# Patient Record
Sex: Male | Born: 1975 | Race: White | Hispanic: Yes | Marital: Married | Smoking: Never smoker
Health system: Southern US, Community
[De-identification: ages and names within clinical notes are randomized; demographics above are authoritative.]

---

## 2009-12-23 ENCOUNTER — Inpatient Hospital Stay (HOSPITAL_COMMUNITY): Admission: EM | Admit: 2009-12-23 | Discharge: 2009-12-25 | Payer: Self-pay | Admitting: Emergency Medicine

## 2010-10-08 LAB — DIFFERENTIAL
Eosinophils Absolute: 0 10*3/uL (ref 0.0–0.7)
Eosinophils Relative: 0 % (ref 0–5)
Neutro Abs: 16.1 10*3/uL — ABNORMAL HIGH (ref 1.7–7.7)
Neutrophils Relative %: 94 % — ABNORMAL HIGH (ref 43–77)

## 2010-10-08 LAB — BASIC METABOLIC PANEL
BUN: 24 mg/dL — ABNORMAL HIGH (ref 6–23)
CO2: 21 mEq/L (ref 19–32)
Calcium: 9.1 mg/dL (ref 8.4–10.5)
Chloride: 107 mEq/L (ref 96–112)
Creatinine, Ser: 1.13 mg/dL (ref 0.4–1.5)
GFR calc Af Amer: >60 mL/min (ref >60)
GFR calc non Af Amer: >60 mL/min (ref >60)
Sodium: 139 mEq/L (ref 135–145)

## 2010-10-08 LAB — CBC
HCT: 43.4 % (ref 39.0–52.0)
Hemoglobin: 15 g/dL (ref 13.0–17.0)
Platelets: 358 10*3/uL (ref 150–400)
RBC: 4.73 MIL/uL (ref 4.22–5.81)
RDW: 13.7 % (ref 11.5–15.5)

## 2010-10-25 IMAGING — CR DG CHEST 2V
2 series · 2 of 2 positions shown · non-contrast
Comparison: None

CLINICAL DATA: Fracture of the wrist.  Preoperative respiratory
exam.

CHEST - 2 VIEW

[w chest pa]
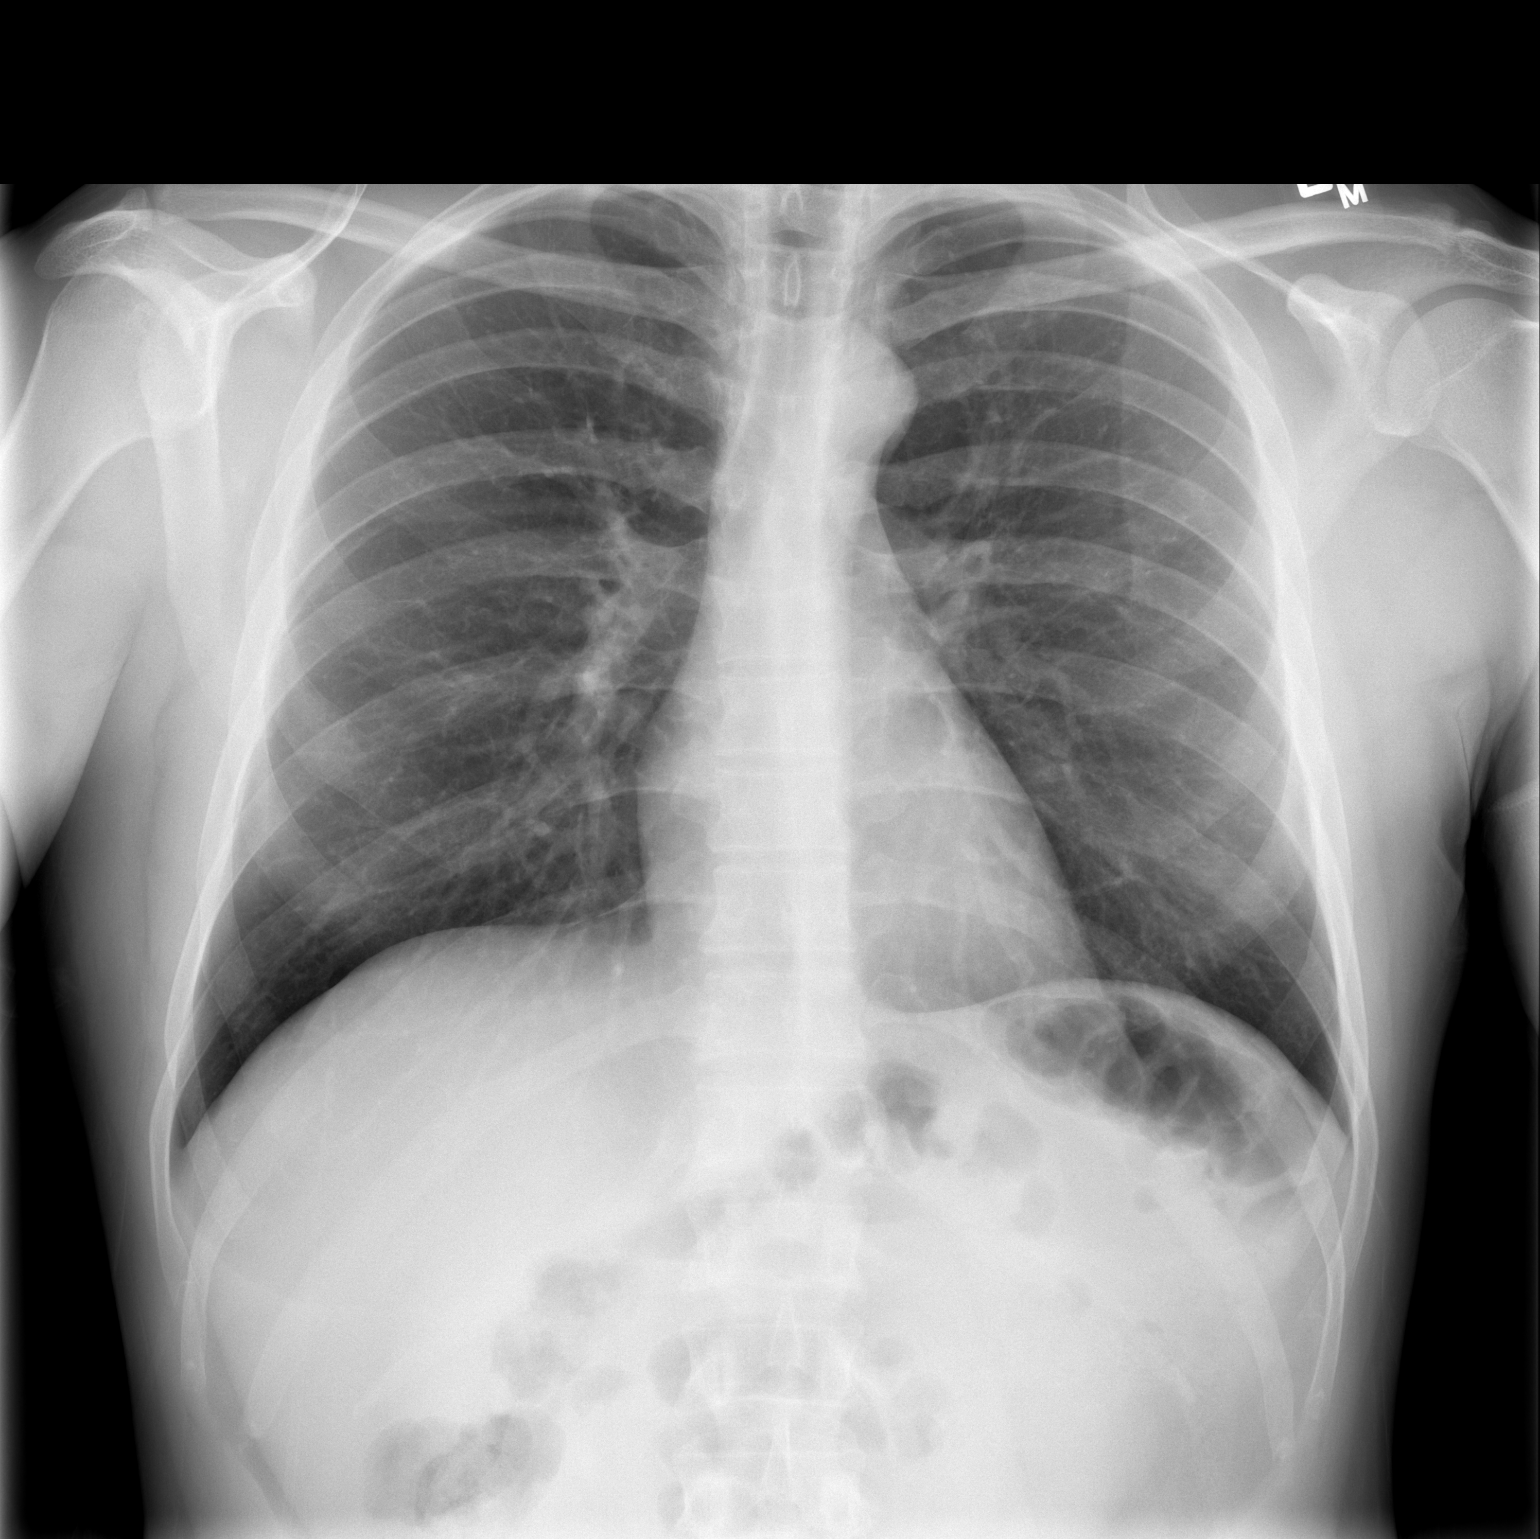

[w chest lat]
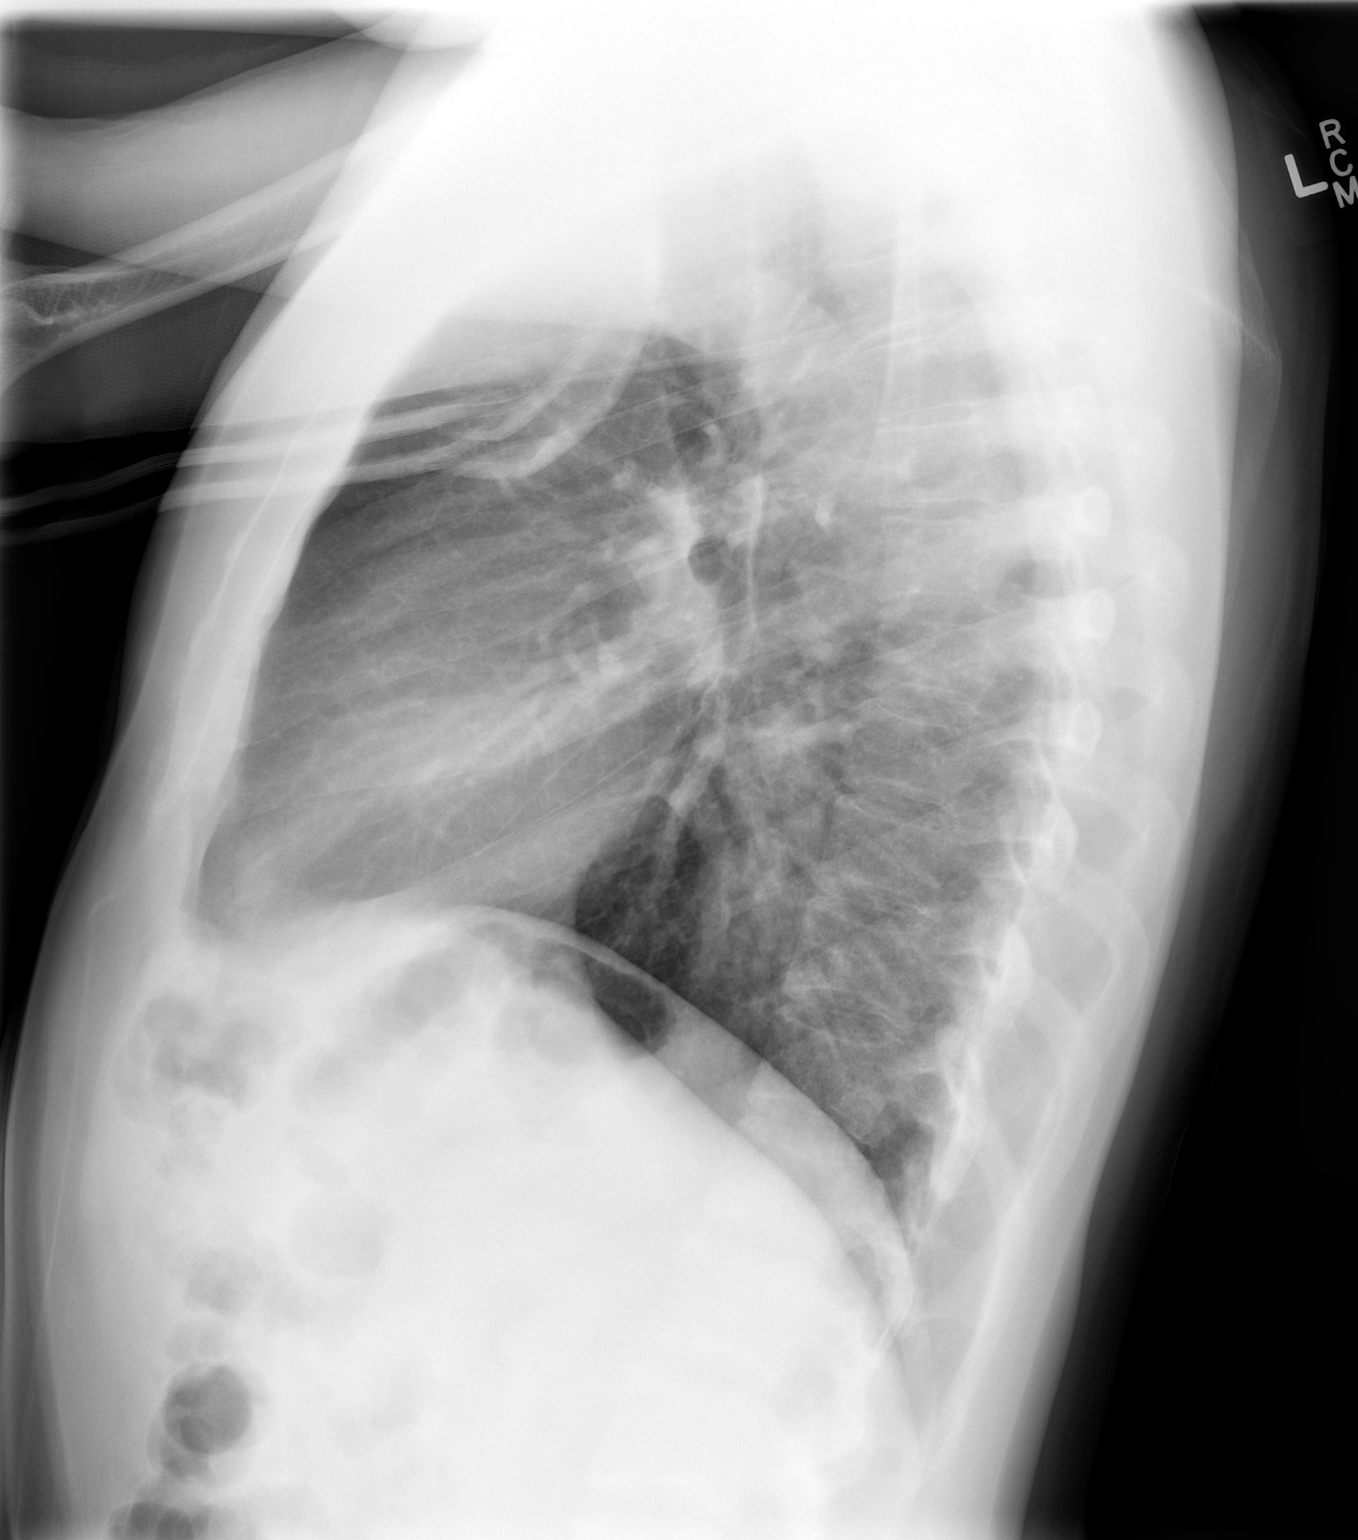

[2 of 2 positions shown; findings below may reference images not displayed]

FINDINGS: Heart size is normal.  The mediastinum is unremarkable.
The lungs are clear.  The vascularity is normal.  No effusions.  No
bony abnormalities.
IMPRESSION: Normal chest

## 2010-10-25 IMAGING — CT CT EXTREM UP W/O CM*L*
3 of 4 series · 11 of 20 positions shown, 13 images · non-contrast
Comparison: Radiography same Truy-Ann Golaub.

CLINICAL DATA: Soccer injury.  Fracture.

CT LEFT WRIST WITHOUT CONTRAST
TECHNIQUE: Multidetector CT imaging of the left wrist was
performed according to the standard protocol without intravenous
contrast. Multiplanar CT image reconstructions were also generated.

[Series 6: extremitywrist 2.0 (id) · axial · 0.42mm/px · z∈[+1018,+1138]mm · 6 of 86 slices shown, 8 images]
[im 13/86  soft-tissue]
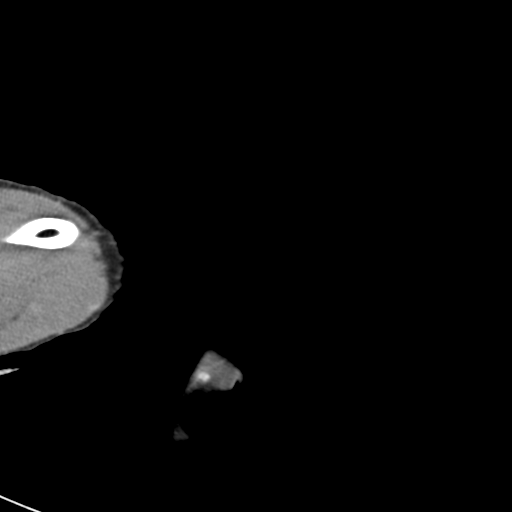
[im 13/86  bone]
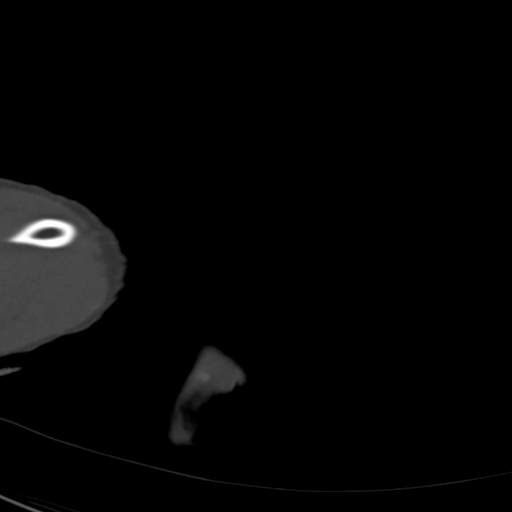
[im 25/86  bone]
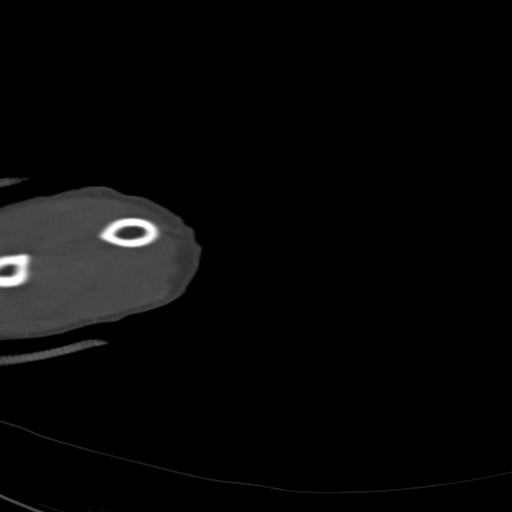
[im 37/86  bone]
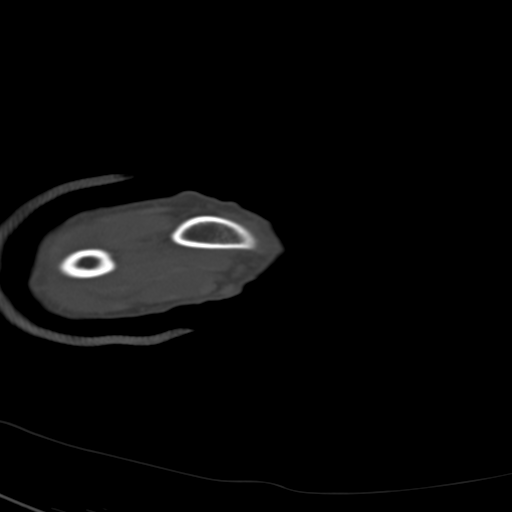
[im 49/86  bone]
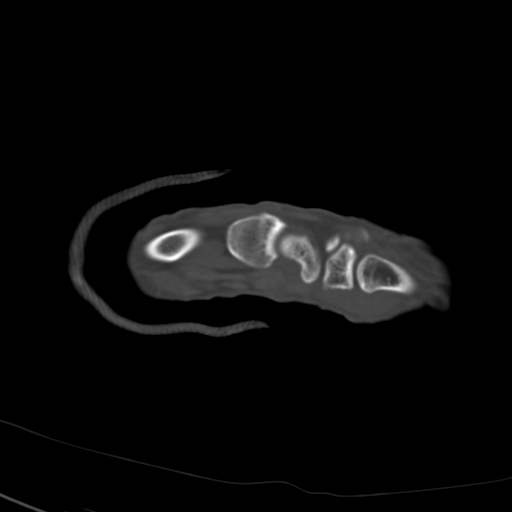
[im 61/86  soft-tissue]
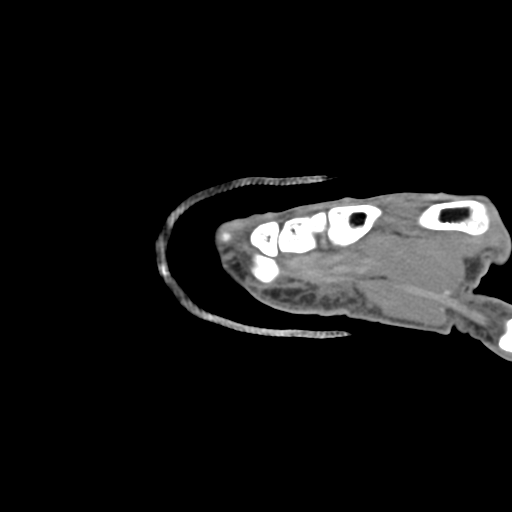
[im 61/86  bone]
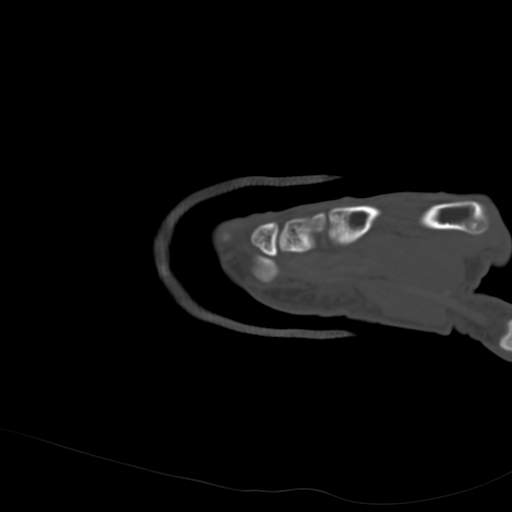
[im 73/86  bone]
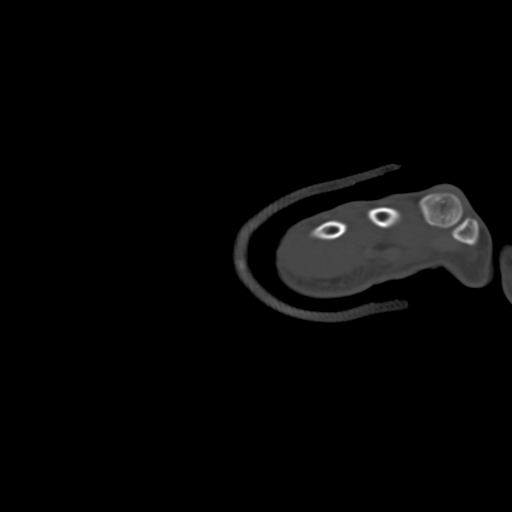

[Series 602: coronal · coronal · 0.42mm/px · 1 of 41 slices shown]
[im 21/41  bone]
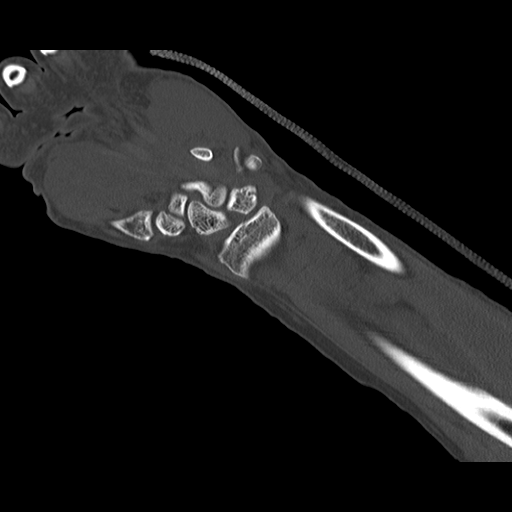

[Series 603: sag · axial · 0.42mm/px · z∈[+1128,+1165]mm · 4 of 63 slices shown]
[im 13/63  bone]
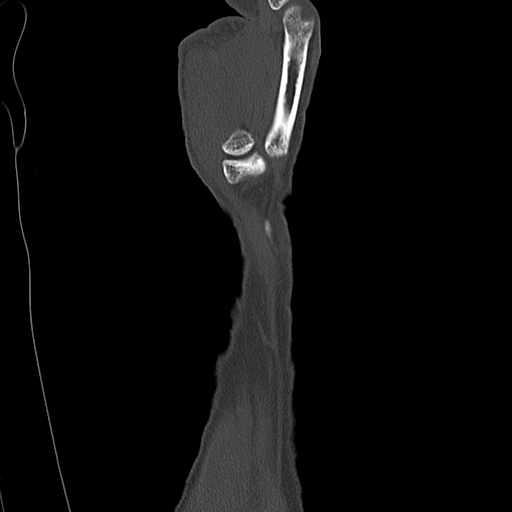
[im 25/63  bone]
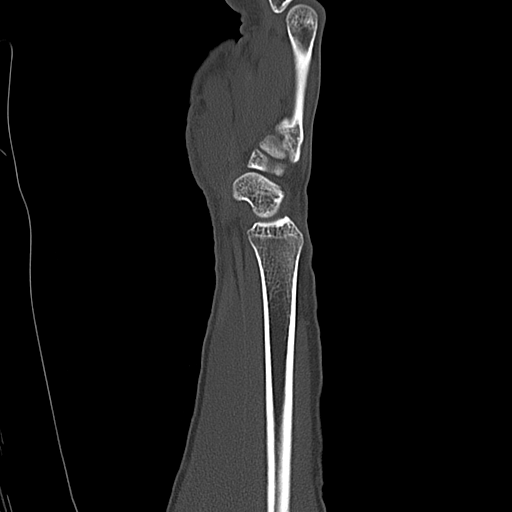
[im 38/63  bone]
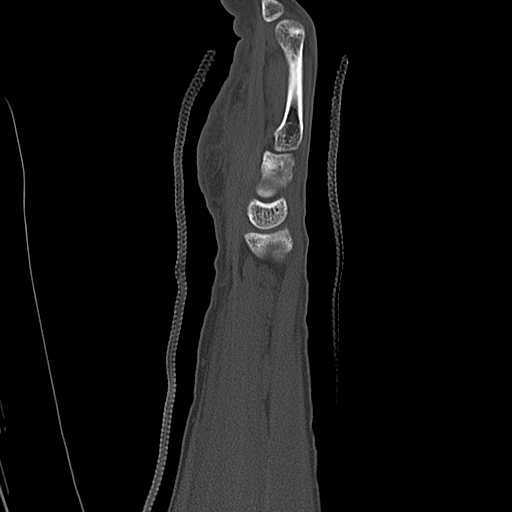
[im 50/63  bone]
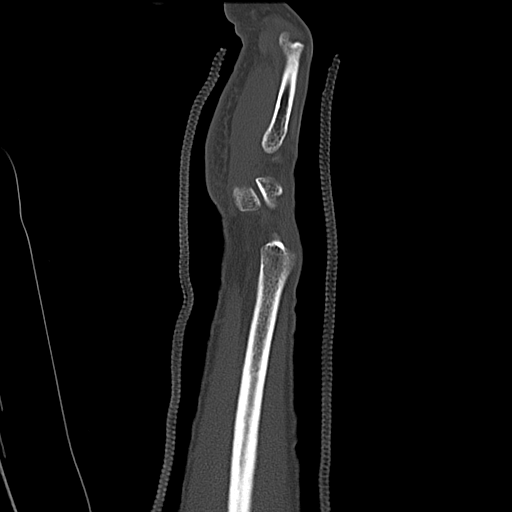

[11 of 20 positions shown; findings below may reference images not displayed]

FINDINGS: I do not see any fracture of the distal radius or ulna.
Carpal bones likewise appear normal.  No metacarpal injury or
metacarpal phalangeal joint injury is seen.  Beyond that is not
evaluated.

3-D re-formations are likewise normal.
IMPRESSION: Negative examination.  No discernible abnormality.

## 2017-01-10 ENCOUNTER — Telehealth: Payer: Self-pay | Admitting: General Practice

## 2017-01-10 NOTE — Telephone Encounter (Signed)
[facility] called to check the status of the referral for the patient sent via fax. Call patient to schedule

## 2017-01-14 NOTE — Telephone Encounter (Signed)
In review with Dr. Everardo AllEllison

## 2017-03-04 ENCOUNTER — Encounter: Payer: Self-pay | Admitting: Internal Medicine

## 2017-03-04 ENCOUNTER — Ambulatory Visit (INDEPENDENT_AMBULATORY_CARE_PROVIDER_SITE_OTHER): Payer: Self-pay | Admitting: Internal Medicine

## 2017-03-04 VITALS — BP 122/82 | HR 59 | Ht 67.0 in | Wt 181.0 lb

## 2017-03-04 DIAGNOSIS — E0590 Thyrotoxicosis, unspecified without thyrotoxic crisis or storm: Secondary | ICD-10-CM | POA: Diagnosis not present

## 2017-03-04 LAB — TSH: TSH: 2.95 u[IU]/mL (ref 0.35–4.50)

## 2017-03-04 LAB — T4, FREE: FREE T4: 0.8 ng/dL (ref 0.60–1.60)

## 2017-03-04 LAB — T3, FREE: T3 FREE: 3.4 pg/mL (ref 2.3–4.2)

## 2017-03-04 NOTE — Patient Instructions (Addendum)
Please stop at the lab.  Please come back for a follow-up appointment in 3 months.   Hipertiroidismo (Hyperthyroidism) El hipertiroidismo ocurre cuando la tiroides est demasiado activa (hiperactiva). La tiroides es una glndula grande ubicada en el cuello que ayuda a Chief Operating Officercontrolar la forma en que el organismo Botswanausa los alimentos (metabolismo). Cuando la tiroides est hiperactiva, produce una cantidad excesiva de una hormona llamada tiroxina. CAUSAS Las causas del hipertiroidismo pueden incluir lo siguiente:  Enfermedad de Graves-Basedow. Se produce cuando el sistema inmunitario ataca la tiroides. sta es la causa ms frecuente.  Inflamacin de la tiroides.  Tumor en la tiroides o Chief of Staffen otro lugar.  Uso excesivo de medicamentos para la tiroides, entre ellos: ? Suplementos recetados para la tiroides. ? Suplementos a base de hierbas que imitan a las hormonas tiroideas.  Bultos slidos o llenos de lquido en la tiroides (ndulos tiroideos).  Ingesta excesiva de yodo. FACTORES DE RIESGO  Ser mujer.  Tener antecedentes familiares de enfermedades tiroideas.  SIGNOS Y SNTOMAS Los signos y los sntomas de hipertiroidismo pueden ser los siguientes:  Nerviosismo.  Incapacidad para Patent examinertolerar el calor.  Prdida de peso sin causa aparente.  Diarrea.  Cambios en la textura del pelo o la piel.  Falta de latidos cardacos o ms latidos cardacos.  Frecuencia cardaca acelerada.  Ausencia de la Brink's Companymenstruacin.  Temblores en las manos.  Fatiga.  Agitacin.  Aumento del apetito.  Problemas para dormir.  Aumento del tamao de la tiroides o ndulos. DIAGNSTICO El diagnstico de hipertiroidismo puede incluir lo siguiente:  Examen fsico e historia clnica.  Anlisis de [place]ald[place]angre.  Ecografas. TRATAMIENTO El tratamiento puede incluir lo siguiente:  Medicamentos para Scientist, physiologicalcontrolar la tiroides.  Ciruga para extirpar la tiroides.  Radioterapia. INSTRUCCIONES PARA EL CUIDADO EN EL  HOGAR  Tome los medicamentos solamente como se lo haya indicado el mdico.  No consuma ningn producto que contenga tabaco, lo que incluye cigarrillos, tabaco de Theatre managermascar o Administrator, Civil Servicecigarrillos electrnicos. Si necesita ayuda para dejar de fumar, consulte al mdico.  No reanude la actividad fsica ni los ejercicios hasta que el mdico lo autorice.  Concurra a todas las visitas de control, segn le indique su mdico. Esto es importante.  SOLICITE ATENCIN MDICA SI:  Los sntomas no mejoran con Scientist, research (medical)el tratamiento.  Tiene fiebre.  Est tomando medicamentos sustitutivos de la tiroides y: ? Est deprimido. ? Se siente mental y fsicamente lento. ? Aumenta de Enetaipeso.  SOLICITE ATENCIN MDICA DE INMEDIATO SI:  Disminuy su estado de alerta o hay cambios en la conciencia.  Siente dolor abdominal.  Siente mareos.  Tiene una frecuencia cardaca acelerada.  Tiene latidos cardacos irregulares.  Esta informacin no tiene Theme park managercomo fin reemplazar el consejo del mdico. Asegrese de hacerle al mdico cualquier pregunta que tenga. Document Released: 07/08/2005 Document Revised: 07/29/2014 Document Reviewed: 11/23/2013 Elsevier Interactive Patient Education  2017 [employer]

## 2017-03-04 NOTE — Progress Notes (Signed)
Patient ID: Xavier Lambert, male   DOB: Jan 01, 1976, 41 y.o.   MRN: [id number]    HPI  Xavier Lambert is a 41 y.o.-year-old male, referred by his PCP, Xavier Monday, FNP  for evaluation for thyrotoxicosis. He is here with his wife who translates for [place] and offers more information about patient's symptoms from 2 months ago, and also past medical history.   Patient presented to see his PCP on 12/27/2016 complaining of sore throat, ear pain, night fevers, chills, joint aches and fatigue. A TSH checked at that time was very suppressed. At that time, he was started on ABx >> URI resolved. Since yesterday, he started to have congestion and sensation he has to clear his throat.  I riewed pt's thyroid tests: 01/02/2017: TSH 0.006 - he had a URI at that time. No results found for: TSH, FREET4   Pt denies feeling nodules in neck, hoarseness, dysphagia/odynophagia, SOB with lying down; he c/o: - + fatigue >> improved - resolved excessive sweating/heat intolerance - no tremors - no anxiety - no palpitations - no hyperdefecation - + weight gain - + fluid retention  Pt does have a FH of thyroid ds - father and sister, niece. No FH of thyroid cancer. No h/o radiation tx to head or neck.  No seaweed or kelp, no recent contrast studies. + steroid use in L back -2x inj - 6 mo ago. No herbal supplements. No Biotin use.  He has Prostate Htf >> on tamsulosin.  ROS: Constitutional: + please see HPI Eyes: no blurry vision, no xerophthalmia ENT: no sore throat, + see HPI Cardiovascular: no CP/SOB/palpitations/leg swelling Respiratory: no cough/SOB Gastrointestinal: no N/V/D/C Musculoskeletal: no muscle/joint aches Skin: no rashes, + hair loss Neurological: no tremors/numbness/tingling/dizziness Psychiatric: no depression/anxiety  PMH: - Prostate hypertrophy - Fatty liver   No PSxH.   Social History   Social History  . Marital status: Married    Spouse name: N/A  . Number of children:  1   Occupational History  . painter   Social History Main Topics  . Smoking status: Never Smoker  . Smokeless tobacco: Never Used  . Alcohol use 2 beers a mo  . Drug use: No   Current Outpatient Prescriptions  Medication Sig Dispense Refill  . tamsulosin (FLOMAX) 0.4 MG CAPS capsule TAKE ONE CAPSULE BY MOUTH DAILY 1/2 HOUR AFTER SUPPER  3   No current facility-administered medications for this visit.    Allergies no known allergies   FH: - please see HPI  PE: BP 122/82 (BP Location: Left Arm, Patient Position: Sitting)   Pulse (!) 59   Ht 5\' 7"  (1.702 m)   Wt 181 lb (82.1 kg)   SpO2 98%   BMI 28.35 kg/m  Wt Readings from Last 3 Encounters:  03/04/17 181 lb (82.1 kg)   Constitutional: overweight, in NAD Eyes: PERRLA, EOMI, no exophthalmos, no lid lag, no stare ENT: moist mucous membranes, + slight symmetric thyromegaly, no thyroid bruits, no cervical lymphadenopathy Cardiovascular: RRR, No MRG Respiratory: CTA B Gastrointestinal: abdomen soft, NT, ND, BS+ Musculoskeletal: no deformities, strength intact in all 4 Skin: moist, warm, no rashes Neurological: no tremor with outstretched hands, DTR normal in all 4  ASSESSMENT: 1. Thyrotoxicosis  PLAN:  1. Patient with a low TSH checked 2 mo ago,  during an episode of URI. At that time, he had fever, chills, fatigue, joint pain which have all resolved.  In the last few days, he also feels a little under the weather,  but without the symptoms that he had in June. - We discussed that possible causes of thyrotoxicosis are:  Graves ds   Thyroiditis (most likely dx) toxic multinodular goiter/ toxic adenoma (I cannot feel nodules at palpation of his thyroid). - I suggested that we check the TSH, fT3 and fT4 and also add thyroid stimulating antibodies to screen for Graves' disease.  - If the tests remain abnormal, we may need an uptake and scan to differentiate between the 3 above possible etiologies  - we discussed about  possible modalities of treatment for the above conditions, to include methimazole use, radioactive iodine ablation or (last resort) surgery. - we might need to do thyroid ultrasound depending on the results of the uptake and scan (if a cold nodule is present) - I do not feel that we need to add beta blockers at this time, since he is not tachycardic, anxious, or tremulous - I advised him to join my chart to communicate easier - RTC in 3 months, but likely sooner for repeat labs  Component     Latest Ref Rng & Units 03/04/2017  T4,Free(Direct)     0.60 - 1.60 ng/dL 1.610.80  Triiodothyronine,Free,Serum     2.3 - 4.2 pg/mL 3.4  TSH     0.35 - 4.50 uIU/mL 2.95  TSI     <140 % baseline <89   Labs are now normal. We'll repeat his labs in 3 months, but no intervention is needed for now. It does appear that she had an episode of thyroiditis which has resolved.  Carlus Pavlovristina Jon Kasparek, MD PhD [facility]

## 2017-03-07 ENCOUNTER — Telehealth: Payer: Self-pay

## 2017-03-07 LAB — THYROID STIMULATING IMMUNOGLOBULIN

## 2017-03-07 NOTE — Telephone Encounter (Signed)
-----   Message from Carlus Pavlov, MD sent at 03/07/2017 12:56 PM EDT ----- Raynelle Fanning, can you please call pt:  Labs are now normal. We'll repeat his labs in 3 months when he returns, but no intervention is needed for now. It does appear that she had an episode of thyroiditis (thyroid inflammation) which has resolved.

## 2017-03-07 NOTE — Telephone Encounter (Signed)
Attempted to contact patient, number not in service.

## 2017-03-11 ENCOUNTER — Telehealth: Payer: Self-pay

## 2017-03-11 NOTE — Telephone Encounter (Signed)
Attempted to contact patient again regarding the lab. Number not in service, will print letter to mail to patient.

## 2017-03-11 NOTE — Telephone Encounter (Signed)
-----   Message from Cristina Gherghe, MD sent at 03/07/2017 12:56 PM EDT ----- Edmund Holcomb, can you please call pt:  Labs are now normal. We'll repeat his labs in 3 months when he returns, but no intervention is needed for now. It does appear that she had an episode of thyroiditis (thyroid inflammation) which has resolved. 

## 2017-03-16 ENCOUNTER — Encounter: Payer: Self-pay | Admitting: Internal Medicine

## 2017-06-06 ENCOUNTER — Ambulatory Visit: Payer: BC Managed Care – PPO | Admitting: Internal Medicine

## 2017-09-10 ENCOUNTER — Ambulatory Visit: Payer: BC Managed Care – PPO | Admitting: Sports Medicine

## 2017-09-10 ENCOUNTER — Encounter: Payer: Self-pay | Admitting: Sports Medicine

## 2017-09-10 VITALS — BP 125/81 | HR 59 | Ht 66.0 in | Wt 191.0 lb

## 2017-09-10 DIAGNOSIS — M25571 Pain in right ankle and joints of right foot: Secondary | ICD-10-CM | POA: Diagnosis not present

## 2017-09-10 DIAGNOSIS — G8929 Other chronic pain: Secondary | ICD-10-CM

## 2017-09-10 DIAGNOSIS — M6740 Ganglion, unspecified site: Secondary | ICD-10-CM | POA: Diagnosis not present

## 2017-09-10 NOTE — Progress Notes (Signed)
Subjective:  Xavier Lambert is a 42 y.o. male patient who presents to office for evaluation of right ankle pain. Patient complains of continued pain in the ankle when he is wearing high top boots otherwise there is no pain.  Patient states that there is a large bump on his ankle that has been present since 2011.  Patient denies any frank injury however he is a Database administratorsoccer player and states that he got the MRI done because he noticed the bump there he left it alone because it was not bothering him however now when he wears his work boots he feels like 2 rocks of rubbing together against that area states that he has seen 3 doctors before coming here and wants to further discuss treatment options. Denies recent injury/trip/fall/sprain/any other causative factors.   Review of Systems  Skin:       Bump on right ankle  All other systems reviewed and are negative.    Patient Active Problem List   Diagnosis Date Noted  . Thyrotoxicosis without thyroid storm 03/04/2017    Current Outpatient Medications on File Prior to Visit  Medication Sig Dispense Refill  . naproxen (NAPROSYN) 500 MG tablet TAKE ONE TABLET BY MOUTH EVERY 12 HOURS WITH FOOD OR MILK FOR 10 DAYS THEN TWICE DAILY AS NEEDED FOR PAIN  0  . tamsulosin (FLOMAX) 0.4 MG CAPS capsule TAKE ONE CAPSULE BY MOUTH DAILY 1/2 HOUR AFTER SUPPER  3   No current facility-administered medications on file prior to visit.     No Known Allergies  Objective:  General: Alert and oriented x3 in no acute distress  Dermatology: There is a raised soft tissue mass that measures 1.5 cm in diameter that is freely mobile with no pulsation or transillumination likely consistent with cyst at right lateral ankle, no open lesions bilateral lower extremities, no webspace macerations, no ecchymosis bilateral, all nails x 10 are well manicured.  Vascular: Dorsalis Pedis and Posterior Tibial pedal pulses palpable, Capillary Fill Time 3 seconds,(+) pedal hair growth  bilateral, no edema bilateral lower extremities, Temperature gradient within normal limits.  Neurology: Michaell CowingGross sensation intact via light touch bilateral. (- )Tinels sign bilateral.   Musculoskeletal: Minimal tenderness to palpation at right ankle cyst. Negative talar tilt, Negative tib-fib stress, No instability. No pain with calf compression bilateral. Range of motion within normal limits without guarding on right ankle.  Strength within normal limits in all groups bilateral.    Assessment and Plan: Problem List Items Addressed This Visit    None    Visit Diagnoses    Ganglion cyst    -  Primary   Chronic pain of right ankle           -Complete examination performed -Previous MRI reviewed from 2011 revealing a 8 mm mass consistent with MRI sequence changes supportive of cyst at that time in 2011 there was suspected a small split tear of the Peroneus brevis tendon and no other acute findings -Discussed treatement options for continued ganglion cyst that is larger in size -Patient is considering surgery however wants to wait to have this done sometime in November since he has started a new job and opts at this time to closely monitor the area.  I advised patient to refrain from boots or shoes that rub or irritate the area and for work to wear well-padded socks or to will pad the area to prevent against rubbing and irritation -Recommend protection and rest ice elevation as needed -Patient to return to office  in October for surgery consult for the month of November.  I advised patient at this time we will get an updated MRI to further evaluate the changes in the extent of the ganglion cyst.  Patient to return to office as scheduled or sooner if problems or issues arise.  Asencion Islam, DPM

## 2018-05-20 ENCOUNTER — Ambulatory Visit: Payer: BC Managed Care – PPO | Admitting: Sports Medicine
